# Patient Record
Sex: Male | Born: 1990 | Race: White | Hispanic: No | Marital: Single | Smoking: Current every day smoker
Health system: Southern US, Community
[De-identification: ages and names within clinical notes are randomized; demographics above are authoritative.]

## PROBLEM LIST (undated history)

## (undated) ENCOUNTER — Ambulatory Visit: Admission: EM | Payer: Self-pay

## (undated) DIAGNOSIS — Z973 Presence of spectacles and contact lenses: Secondary | ICD-10-CM

## (undated) DIAGNOSIS — Z8619 Personal history of other infectious and parasitic diseases: Secondary | ICD-10-CM

## (undated) HISTORY — PX: APPENDECTOMY: SHX54

## (undated) HISTORY — DX: Personal history of other infectious and parasitic diseases: Z86.19

## (undated) HISTORY — PX: WISDOM TOOTH EXTRACTION: SHX21

---

## 2014-03-22 ENCOUNTER — Emergency Department: Payer: Self-pay | Admitting: Emergency Medicine

## 2014-03-26 ENCOUNTER — Encounter (HOSPITAL_BASED_OUTPATIENT_CLINIC_OR_DEPARTMENT_OTHER): Payer: Self-pay | Admitting: *Deleted

## 2014-03-26 ENCOUNTER — Other Ambulatory Visit (HOSPITAL_BASED_OUTPATIENT_CLINIC_OR_DEPARTMENT_OTHER): Payer: Self-pay | Admitting: Orthopaedic Surgery

## 2014-03-26 NOTE — Progress Notes (Signed)
Will have friend bring him-aware someone has to stay with him 24hr post op

## 2014-03-27 ENCOUNTER — Ambulatory Visit (HOSPITAL_BASED_OUTPATIENT_CLINIC_OR_DEPARTMENT_OTHER)
Admission: RE | Admit: 2014-03-27 | Discharge: 2014-03-27 | Disposition: A | Discharge status: Home / Self Care | Payer: Self-pay | Source: Ambulatory Visit | Attending: Orthopaedic Surgery | Admitting: Orthopaedic Surgery

## 2014-03-27 ENCOUNTER — Ambulatory Visit (HOSPITAL_BASED_OUTPATIENT_CLINIC_OR_DEPARTMENT_OTHER): Payer: Self-pay | Admitting: Anesthesiology

## 2014-03-27 ENCOUNTER — Encounter (HOSPITAL_BASED_OUTPATIENT_CLINIC_OR_DEPARTMENT_OTHER)
Admission: RE | Disposition: A | Discharge status: Home / Self Care | Payer: Self-pay | Source: Ambulatory Visit | Attending: Orthopaedic Surgery

## 2014-03-27 ENCOUNTER — Encounter (HOSPITAL_BASED_OUTPATIENT_CLINIC_OR_DEPARTMENT_OTHER): Payer: Self-pay | Admitting: Anesthesiology

## 2014-03-27 DIAGNOSIS — S62622A Displaced fracture of medial phalanx of right middle finger, initial encounter for closed fracture: Secondary | ICD-10-CM | POA: Insufficient documentation

## 2014-03-27 DIAGNOSIS — F1210 Cannabis abuse, uncomplicated: Secondary | ICD-10-CM | POA: Insufficient documentation

## 2014-03-27 DIAGNOSIS — X58XXXA Exposure to other specified factors, initial encounter: Secondary | ICD-10-CM | POA: Insufficient documentation

## 2014-03-27 DIAGNOSIS — F17210 Nicotine dependence, cigarettes, uncomplicated: Secondary | ICD-10-CM | POA: Insufficient documentation

## 2014-03-27 DIAGNOSIS — F1099 Alcohol use, unspecified with unspecified alcohol-induced disorder: Secondary | ICD-10-CM | POA: Insufficient documentation

## 2014-03-27 HISTORY — DX: Presence of spectacles and contact lenses: Z97.3

## 2014-03-27 HISTORY — PX: CLOSED REDUCTION FINGER WITH PERCUTANEOUS PINNING: SHX5612

## 2014-03-27 SURGERY — CLOSED REDUCTION, FINGER, WITH PERCUTANEOUS PINNING
Anesthesia: General | Site: Finger | Laterality: Right

## 2014-03-27 MED ORDER — FENTANYL CITRATE 0.05 MG/ML IJ SOLN
INTRAMUSCULAR | Status: AC
  Filled 2014-03-27: qty 6

## 2014-03-27 MED ORDER — MEPERIDINE HCL 25 MG/ML IJ SOLN: 6.2500 mg | INTRAMUSCULAR | Status: DC | PRN

## 2014-03-27 MED ORDER — ONDANSETRON HCL 4 MG/2ML IJ SOLN
4.0000 mg | Freq: Once | INTRAMUSCULAR | Status: DC | PRN
Start: 2014-03-27 — End: 2014-03-27

## 2014-03-27 MED ORDER — HYDROMORPHONE HCL 1 MG/ML IJ SOLN
0.2500 mg | INTRAMUSCULAR | Status: DC | PRN
  Administered 2014-03-27: 0.5 mg via INTRAVENOUS

## 2014-03-27 MED ORDER — MIDAZOLAM HCL 2 MG/2ML IJ SOLN
INTRAMUSCULAR | Status: AC
  Filled 2014-03-27: qty 2

## 2014-03-27 MED ORDER — BUPIVACAINE HCL (PF) 0.25 % IJ SOLN
INTRAMUSCULAR | Status: AC
  Filled 2014-03-27: qty 30

## 2014-03-27 MED ORDER — LACTATED RINGERS IV SOLN
INTRAVENOUS | Status: DC
  Administered 2014-03-27 (×2): via INTRAVENOUS

## 2014-03-27 MED ORDER — CEFAZOLIN SODIUM-DEXTROSE 2-3 GM-% IV SOLR
INTRAVENOUS | Status: AC
  Filled 2014-03-27: qty 50

## 2014-03-27 MED ORDER — LACTATED RINGERS IV SOLN
INTRAVENOUS | Status: DC
  Administered 2014-03-27: 11:00:00 via INTRAVENOUS

## 2014-03-27 MED ORDER — HYDROMORPHONE HCL 1 MG/ML IJ SOLN
INTRAMUSCULAR | Status: AC
  Filled 2014-03-27: qty 1

## 2014-03-27 MED ORDER — CEFAZOLIN SODIUM-DEXTROSE 2-3 GM-% IV SOLR: 2.0000 g | INTRAVENOUS | Status: DC

## 2014-03-27 MED ORDER — MIDAZOLAM HCL 5 MG/5ML IJ SOLN
INTRAMUSCULAR | Status: DC | PRN
  Administered 2014-03-27: 2 mg via INTRAVENOUS

## 2014-03-27 MED ORDER — CEFAZOLIN SODIUM-DEXTROSE 2-3 GM-% IV SOLR
INTRAVENOUS | Status: DC | PRN
  Administered 2014-03-27: 2 g via INTRAVENOUS

## 2014-03-27 MED ORDER — OXYCODONE HCL 5 MG PO TABS: 5.0000 mg | ORAL_TABLET | ORAL | Status: DC | PRN

## 2014-03-27 MED ORDER — ONDANSETRON HCL 4 MG/2ML IJ SOLN
INTRAMUSCULAR | Status: DC | PRN
  Administered 2014-03-27: 4 mg via INTRAVENOUS

## 2014-03-27 MED ORDER — MIDAZOLAM HCL 2 MG/2ML IJ SOLN
1.0000 mg | INTRAMUSCULAR | Status: DC | PRN
  Administered 2014-03-27: 2 mg via INTRAVENOUS

## 2014-03-27 MED ORDER — FENTANYL CITRATE 0.05 MG/ML IJ SOLN
50.0000 ug | INTRAMUSCULAR | Status: DC | PRN
Start: 2014-03-27 — End: 2014-03-27

## 2014-03-27 MED ORDER — DEXAMETHASONE SODIUM PHOSPHATE 4 MG/ML IJ SOLN
INTRAMUSCULAR | Status: DC | PRN
  Administered 2014-03-27: 10 mg via INTRAVENOUS

## 2014-03-27 MED ORDER — PROPOFOL 10 MG/ML IV BOLUS
INTRAVENOUS | Status: DC | PRN
  Administered 2014-03-27: 200 mg via INTRAVENOUS

## 2014-03-27 MED ORDER — FENTANYL CITRATE 0.05 MG/ML IJ SOLN
INTRAMUSCULAR | Status: DC | PRN
  Administered 2014-03-27: 100 ug via INTRAVENOUS

## 2014-03-27 MED ORDER — LIDOCAINE HCL (CARDIAC) 20 MG/ML IV SOLN
INTRAVENOUS | Status: DC | PRN
  Administered 2014-03-27: 80 mg via INTRAVENOUS

## 2014-03-27 MED ORDER — BUPIVACAINE HCL (PF) 0.25 % IJ SOLN
INTRAMUSCULAR | Status: DC | PRN
  Administered 2014-03-27: 2 mL

## 2014-03-27 SURGICAL SUPPLY — 52 items
BANDAGE ELASTIC 3 VELCRO ST LF (GAUZE/BANDAGES/DRESSINGS) ×4 IMPLANT
BLADE HEX COATED 2.75 (ELECTRODE) IMPLANT
BLADE MINI RND TIP GREEN BEAV (BLADE) IMPLANT
BLADE SURG 15 STRL LF DISP TIS (BLADE) ×1 IMPLANT
BLADE SURG 15 STRL SS (BLADE) ×1
BNDG ESMARK 4X9 LF (GAUZE/BANDAGES/DRESSINGS) ×2 IMPLANT
BRUSH SCRUB EZ PLAIN DRY (MISCELLANEOUS) ×2 IMPLANT
CAP PIN PROTECTOR ORTHO WHT (CAP) ×2 IMPLANT
CORDS BIPOLAR (ELECTRODE) IMPLANT
COVER BACK TABLE 60X90IN (DRAPES) ×2 IMPLANT
COVER MAYO STAND STRL (DRAPES) ×2 IMPLANT
CUFF TOURNIQUET SINGLE 18IN (TOURNIQUET CUFF) ×2 IMPLANT
DECANTER SPIKE VIAL GLASS SM (MISCELLANEOUS) IMPLANT
DRAPE EXTREMITY T 121X128X90 (DRAPE) ×2 IMPLANT
DRAPE OEC MINIVIEW 54X84 (DRAPES) ×2 IMPLANT
DRAPE SURG 17X23 STRL (DRAPES) ×2 IMPLANT
GAUZE SPONGE 4X4 12PLY STRL (GAUZE/BANDAGES/DRESSINGS) ×2 IMPLANT
GAUZE XEROFORM 1X8 LF (GAUZE/BANDAGES/DRESSINGS) ×2 IMPLANT
GLOVE BIOGEL PI IND STRL 7.0 (GLOVE) ×1 IMPLANT
GLOVE BIOGEL PI IND STRL 7.5 (GLOVE) ×1 IMPLANT
GLOVE BIOGEL PI INDICATOR 7.0 (GLOVE) ×1
GLOVE BIOGEL PI INDICATOR 7.5 (GLOVE) ×1
GLOVE ECLIPSE 6.5 STRL STRAW (GLOVE) ×2 IMPLANT
GLOVE EXAM NITRILE LRG STRL (GLOVE) ×2 IMPLANT
GLOVE NEODERM STRL 7.5 LF PF (GLOVE) ×1 IMPLANT
GLOVE SURG NEODERM 7.5  LF PF (GLOVE) ×1
GLOVE SURG SS PI 7.5 STRL IVOR (GLOVE) ×2 IMPLANT
GLOVE SURG SYN 7.5  E (GLOVE) ×1
GLOVE SURG SYN 7.5 E (GLOVE) ×1 IMPLANT
GOWN PREVENTION PLUS XLARGE (GOWN DISPOSABLE) IMPLANT
GOWN STRL REIN XL XLG (GOWN DISPOSABLE) ×2 IMPLANT
K-WIRE .045X4 (WIRE) ×2 IMPLANT
NEEDLE HYPO 22GX1.5 SAFETY (NEEDLE) ×2 IMPLANT
NEEDLE HYPO 25X1 1.5 SAFETY (NEEDLE) ×2 IMPLANT
NS IRRIG 1000ML POUR BTL (IV SOLUTION) ×2 IMPLANT
PACK BASIN DAY SURGERY FS (CUSTOM PROCEDURE TRAY) ×2 IMPLANT
PAD CAST 3X4 CTTN HI CHSV (CAST SUPPLIES) ×1 IMPLANT
PADDING CAST COTTON 3X4 STRL (CAST SUPPLIES) ×1
PADDING CAST SYNTHETIC 3 NS LF (CAST SUPPLIES) ×3
PADDING CAST SYNTHETIC 3X4 NS (CAST SUPPLIES) ×3 IMPLANT
PADDING CAST SYNTHETIC 4 (CAST SUPPLIES) ×1
PADDING CAST SYNTHETIC 4X4 STR (CAST SUPPLIES) ×1 IMPLANT
SLEEVE SCD COMPRESS KNEE MED (MISCELLANEOUS) ×2 IMPLANT
SPLINT PLASTER CAST XFAST 4X15 (CAST SUPPLIES) ×10 IMPLANT
SPLINT PLASTER XTRA FAST SET 4 (CAST SUPPLIES) ×10
STOCKINETTE 4X48 STRL (DRAPES) ×2 IMPLANT
SUT ETHILON 4 0 PS 2 18 (SUTURE) IMPLANT
SYR BULB 3OZ (MISCELLANEOUS) ×2 IMPLANT
SYR CONTROL 10ML LL (SYRINGE) ×2 IMPLANT
TOWEL OR 17X24 6PK STRL BLUE (TOWEL DISPOSABLE) ×4 IMPLANT
TRAY DSU PREP LF (CUSTOM PROCEDURE TRAY) ×2 IMPLANT
UNDERPAD 30X30 INCONTINENT (UNDERPADS AND DIAPERS) ×2 IMPLANT

## 2014-03-27 NOTE — Transfer of Care (Signed)
Immediate Anesthesia Transfer of Care Note  Patient: Carlos Figueroa  Procedure(s) Performed: Procedure(s): CLOSED REDUCTION PERCUTANEOUS PINNING RIGHT THIRD FINGER (Right)  Patient Location: PACU  Anesthesia Type:General  Level of Consciousness: sedated  Airway & Oxygen Therapy: Patient Spontanous Breathing and Patient connected to face mask oxygen  Post-op Assessment: Report given to RN and Post -op Vital signs reviewed and stable  Post vital signs: Reviewed and stable  Last Vitals:  Filed Vitals:   03/27/14 1105  BP: 121/80  Pulse: 74  Temp: 36.6 C  Resp: 20    Complications: No apparent anesthesia complications

## 2014-03-27 NOTE — H&P (Addendum)
PREOPERATIVE H&P  Chief Complaint: Right 3rd finger fracture  HPI: Carlos Figueroa is a 24 y.o. male who presents for surgical treatment of Right 3rd finger fracture.  He denies any changes in medical history.  Past Medical History  Diagnosis Date  . Wears glasses    Past Surgical History  Procedure Laterality Date  . Wisdom tooth extraction    . Appendectomy      age 24    History reviewed. No pertinent family history. No Known Allergies Prior to Admission medications   Medication Sig Start Date End Date Taking? Authorizing Provider  oxyCODONE-acetaminophen (PERCOCET/ROXICET) 5-325 MG per tablet Take by mouth every 4 (four) hours as needed for severe pain.   Yes Historical Provider, MD     Positive ROS: All other systems have been reviewed and were otherwise negative with the exception of those mentioned in the HPI and as above.  Physical Exam: General: Alert, no acute distress Cardiovascular: No pedal edema Respiratory: No cyanosis, no use of accessory musculature GI: abdomen soft Skin: No lesions in the area of chief complaint Neurologic: Sensation intact distally Psychiatric: Patient is competent for consent with normal mood and affect Lymphatic: no lymphedema  MUSCULOSKELETAL: exam stable  Assessment: Right 3rd finger fracture  Plan: Plan for Procedure(s): CLOSED REDUCTION PERCUTANEOUS PINNING RIGHT 3RD FINGER  The risks benefits and alternatives were discussed with the patient including but not limited to the risks of nonoperative treatment, versus surgical intervention including infection, bleeding, nerve injury,  blood clots, cardiopulmonary complications, morbidity, mortality, among others, and they were willing to proceed.   Carlos Figueroa, Carlos Warrior Michael, MD   03/27/2014 8:11 AM

## 2014-03-27 NOTE — Op Note (Signed)
Date of surgery: 03/27/2014  Preoperative diagnosis: Middle phalanx fracture of right long finger  Postoperative diagnosis same  Procedure: Closed reduction and percutaneous skeletal fixation of middle phalanx fracture of right long finger  Surgeon: Glee ArvinMichael Xu, M.D.  Anesthesia: Gen.  Conditions: None  Condition to PACU: Stable  Indications for procedure: Carlos Figueroa is a 24 year old gentleman who presents today for surgical treatment of the above mentioned condition. He was made aware of his risks benefits and alternatives to surgery he wished to proceed.  Description of procedure: The patient was identified in the preoperative holding area. The operative site was marked by the surgeon confirmed with the patient. He is brought back to the operating room. Nonsterile tourniquet was placed on the upper right arm. General anesthesia was induced. Right upper extremity was prepped and draped in standard sterile fashion. Timeout was performed preoperative antibiotics were given. A right upper extremity was exsanguinated using Esmarch bandage and tourniquet was inflated to 250 mmHg. We were able to affect a closed reduction of the fracture. This was confirmed under mini C-arm. With the fracture reduced we placed 2 0.045 K wires and a crossing fashion from distal to proximal. Care was taken to avoid the proximal phalanx and the joint. Orthogonal x-rays were taken to confirm maintenance of the reduction and adequate pin placement. The tourniquet was deflated and the finger had normal capillary refill. Sterile dressings were applied. The pin ends were capped. The upper extremity was placed in a dorsal blocking splint in intrinsic plus. Patient was extubated and transferred to the PACU in stable condition.  Postoperative plan: Patient will be nonweightbearing to the right upper extremity. He will follow-up in a couple weeks in the office for wound check. We will plan on keeping the pins in for 4-6 weeks.  Mayra ReelN.  Michael Xu, MD [facility] [phone] 12:38 PM

## 2014-03-27 NOTE — Anesthesia Postprocedure Evaluation (Signed)
Anesthesia Post Note  Patient: Research scientist (life sciences)Carlos Figueroa  Procedure(s) Performed: Procedure(s) (LRB): CLOSED REDUCTION PERCUTANEOUS PINNING RIGHT THIRD FINGER (Right)  Anesthesia type: general  Patient location: PACU  Post pain: Pain level controlled  Post assessment: Patient's Cardiovascular Status Stable  Last Vitals:  Filed Vitals:   03/27/14 1355  BP: 126/82  Pulse: 78  Temp: 36.5 C  Resp: 16    Post vital signs: Reviewed and stable  Level of consciousness: sedated  Complications: No apparent anesthesia complications

## 2014-03-27 NOTE — Anesthesia Procedure Notes (Signed)
Procedure Name: LMA Insertion Date/Time: 03/27/2014 11:59 AM Performed by: Genevieve NorlanderLINKA, Carlos Figueroa: Patient identified, Emergency Drugs available, Suction available, Patient being monitored and Timeout performed Patient Re-evaluated:Patient Re-evaluated prior to inductionOxygen Delivery Method: Circle System Utilized Preoxygenation: Pre-oxygenation with 100% oxygen Intubation Type: IV induction Ventilation: Mask ventilation without difficulty LMA: LMA inserted LMA Size: 5.0 Number of attempts: 1 Airway Equipment and Method: Bite block Placement Confirmation: positive ETCO2 Tube secured with: Tape Dental Injury: Teeth and Oropharynx as per pre-operative assessment

## 2014-03-27 NOTE — Anesthesia Preprocedure Evaluation (Signed)
Anesthesia Evaluation  Patient identified by MRN, date of birth, ID band Patient awake    Reviewed: Allergy & Precautions, NPO status , Patient's Chart, lab work & pertinent test results  Airway Mallampati: I  TM Distance: >3 FB Neck ROM: Full    Dental   Pulmonary Current Smoker,          Cardiovascular     Neuro/Psych    GI/Hepatic   Endo/Other    Renal/GU      Musculoskeletal   Abdominal   Peds  Hematology   Anesthesia Other Findings   Reproductive/Obstetrics                             Anesthesia Physical Anesthesia Plan  ASA: I  Anesthesia Plan: General   Post-op Pain Management:    Induction: Intravenous  Airway Management Planned: LMA  Additional Equipment:   Intra-op Plan:   Post-operative Plan: Extubation in OR  Informed Consent: I have reviewed the patients History and Physical, chart, labs and discussed the procedure including the risks, benefits and alternatives for the proposed anesthesia with the patient or authorized representative who has indicated his/her understanding and acceptance.     Plan Discussed with: CRNA and Surgeon  Anesthesia Plan Comments:         Anesthesia Quick Evaluation  

## 2014-03-27 NOTE — Discharge Instructions (Signed)
Postoperative instructions: ° °Weightbearing: non weight bearing ° °Dressing instructions: Keep your dressing and/or splint clean and dry at all times.  It will be removed at your first post-operative appointment.  Your stitches and/or staples will be removed at this visit. ° °Incision instructions:  Do not soak your incision for 3 weeks after surgery.  If the incision gets wet, pat dry and do not scrub the incision. ° °Pain control:  You have been given a prescription to be taken as directed for post-operative pain control.  In addition, elevate the operative extremity above the heart at all times to prevent swelling and throbbing pain. ° °Take over-the-counter Colace, 100mg by mouth twice a day while taking narcotic pain medications to help prevent constipation. ° °Follow up appointments: °1) 10-14 days for suture removal and wound check. °2) Dr. Xu as scheduled. ° ° ------------------------------------------------------------------------------------------------------------- ° °After Surgery Pain Control: ° °After your surgery, post-surgical discomfort or pain is likely. This discomfort can last several days to a few weeks. At certain times of the day your discomfort may be more intense.  °Did you receive a nerve block?  °A nerve block can provide pain relief for one hour to two days after your surgery. As long as the nerve block is working, you will experience little or no sensation in the area the surgeon operated on.  °As the nerve block wears off, you will begin to experience pain or discomfort. It is very important that you begin taking your prescribed pain medication before the nerve block fully wears off. Treating your pain at the first sign of the block wearing off will ensure your pain is better controlled and more tolerable when full-sensation returns. Do not wait until the pain is intolerable, as the medicine will be less effective. It is better to treat pain in advance than to try and catch up.    °General Anesthesia:  °If you did not receive a nerve block during your surgery, you will need to start taking your pain medication shortly after your surgery and should continue to do so as prescribed by your surgeon.  °Pain Medication:  °Most commonly we prescribe Vicodin and Percocet for post-operative pain. Both of these medications contain a combination of acetaminophen (Tylenol®) and a narcotic to help control pain.  °· It takes between 30 and 45 minutes before pain medication starts to work. It is important to take your medication before your pain level gets too intense.  °· Nausea is a common side effect of many pain medications. You will want to eat something before taking your pain medicine to help prevent nausea.  °· If you are taking a prescription pain medication that contains acetaminophen, we recommend that you do not take additional over the counter acetaminophen (Tylenol®).  °Other pain relieving options:  °· Using a cold pack to ice the affected area a few times a day (15 to 20 minutes at a time) can help to relieve pain, reduce swelling and bruising.  °· Elevation of the affected area can also help to reduce pain and swelling. ° ° ° ° °Post Anesthesia Home Care Instructions ° °Activity: °Get plenty of rest for the remainder of the day. A responsible adult should stay with you for 24 hours following the procedure.  °For the next 24 hours, DO NOT: °-Drive a car °-Operate machinery °-Drink alcoholic beverages °-Take any medication unless instructed by your physician °-Make any legal decisions or sign important papers. ° °Meals: °Start with liquid foods such as gelatin   or soup. Progress to regular foods as tolerated. Avoid greasy, spicy, heavy foods. If nausea and/or vomiting occur, drink only clear liquids until the nausea and/or vomiting subsides. Call your physician if vomiting continues.  Special Instructions/Symptoms: Your throat may feel dry or sore from the anesthesia or the breathing tube  placed in your throat during surgery. If this causes discomfort, gargle with warm salt water. The discomfort should disappear within 24 hours.

## 2014-03-28 ENCOUNTER — Encounter (HOSPITAL_BASED_OUTPATIENT_CLINIC_OR_DEPARTMENT_OTHER): Payer: Self-pay | Admitting: Orthopaedic Surgery

## 2014-04-07 DIAGNOSIS — Z973 Presence of spectacles and contact lenses: Secondary | ICD-10-CM | POA: Insufficient documentation

## 2014-04-07 DIAGNOSIS — G8911 Acute pain due to trauma: Secondary | ICD-10-CM | POA: Insufficient documentation

## 2014-04-07 DIAGNOSIS — M79641 Pain in right hand: Secondary | ICD-10-CM | POA: Insufficient documentation

## 2014-04-07 DIAGNOSIS — Z720 Tobacco use: Secondary | ICD-10-CM | POA: Insufficient documentation

## 2014-04-08 ENCOUNTER — Emergency Department (HOSPITAL_COMMUNITY)
Admission: EM | Admit: 2014-04-08 | Discharge: 2014-04-08 | Disposition: A | Discharge status: Home / Self Care | Payer: Self-pay | Attending: Emergency Medicine | Admitting: Emergency Medicine

## 2014-04-08 ENCOUNTER — Emergency Department (HOSPITAL_COMMUNITY): Payer: Self-pay

## 2014-04-08 ENCOUNTER — Encounter (HOSPITAL_COMMUNITY): Payer: Self-pay | Admitting: Emergency Medicine

## 2014-04-08 DIAGNOSIS — M79641 Pain in right hand: Secondary | ICD-10-CM

## 2014-04-08 NOTE — ED Notes (Signed)
Pt. Left with all belongings and refused wheelchair 

## 2014-04-08 NOTE — ED Notes (Signed)
The patient said he had surgery done here a week ago and thinks he may have reinjured his finger.  He is here to have his wound checked.  He says he has hit his hand several times by accident.  He has good capillary refill, fingers are the appropriate color.  He rates his pain 2/10.

## 2014-04-08 NOTE — ED Provider Notes (Signed)
CSN: [id number]     Arrival date & time 04/07/14  2349 History   First MD Initiated Contact with Patient 04/08/14 0113     Chief Complaint  Patient presents with  . Wound Check    The patient said he had surgery done here a week ago and thinks he may have reinjured his finger.  He is here to have his wound checked.  He says he has hit his hand several times by accident.     (Consider location/radiation/quality/duration/timing/severity/associated sxs/prior Treatment) Patient is a 24 y.o. male presenting with wound check. The history is provided by the patient. No language interpreter was used.  Wound Check Associated symptoms include arthralgias (right hand pain ) and numbness. Pertinent negatives include no joint swelling or weakness.  Carlos Figueroa is a 24 y/o M with PMHx of closed reduction of right ring finger with percutaneous pinning performed on 03/27/2014 by Dr. Roda Shutters secondary to fracturing right ring finger after bowling accident. Patient reported that his hand is in a splint, but continues to hit his hand on objects continuously. Reported at 6 PM today he had his hand on the banister resulting in intense burning sensation to his right ring finger. Patient reported that he started to have pins and needle sensation in the right middle finger-reported that this has been ongoing for approximately 2 weeks since his surgery, but reported that it increased. Patient reported that he's been taking Roxicodone without relief. Stated he does have an appointment with orthopedics on 04/12/2014 with Dr. Roda Shutters. Denied changes to temperature, color, fever, red streaks, swelling, bleeding. PCP none  Past Medical History  Diagnosis Date  . Wears glasses    Past Surgical History  Procedure Laterality Date  . Wisdom tooth extraction    . Appendectomy      age 21  . Closed reduction finger with percutaneous pinning Right 03/27/2014    Procedure: CLOSED REDUCTION PERCUTANEOUS PINNING RIGHT THIRD FINGER;   Surgeon: Cheral Almas, MD;  Location: [facility];  Service: Orthopedics;  Laterality: Right;   History reviewed. No pertinent family history. History  Substance Use Topics  . Smoking status: Current Every Day Smoker -- 0.50 packs/day  . Smokeless tobacco: Not on file  . Alcohol Use: Yes     Comment: occ    Review of Systems  Musculoskeletal: Positive for arthralgias (right hand pain ). Negative for joint swelling.  Neurological: Positive for numbness. Negative for weakness.      Allergies  Review of patient's allergies indicates no known allergies.  Home Medications   Prior to Admission medications   Medication Sig Start Date End Date Taking? Authorizing Provider  oxyCODONE (OXY IR/ROXICODONE) 5 MG immediate release tablet Take 1-3 tablets (5-15 mg total) by mouth every 4 (four) hours as needed. 03/27/14   Naiping Glee Arvin, MD  oxyCODONE-acetaminophen (PERCOCET/ROXICET) 5-325 MG per tablet Take by mouth every 4 (four) hours as needed for severe pain.    Historical Provider, MD   BP 132/80 mmHg  Pulse 74  Temp(Src) 98.1 F (36.7 C) (Oral)  Resp 20  Ht  (1.88 m)  Wt 205 lb (92.987 kg)  BMI 26.31 kg/m2  SpO2 98% Physical Exam  Constitutional: He is oriented to person, place, and time. He appears well-developed and well-nourished. No distress.  HENT:  Head: Normocephalic and atraumatic.  Eyes: Conjunctivae and EOM are normal. Right eye exhibits no discharge. Left eye exhibits no discharge.  Neck: Normal range of motion. Neck supple.  Cardiovascular: Normal rate, regular rhythm and normal heart sounds.  Exam reveals no friction rub.   No murmur heard. Cap refill < 3 seconds  Pulmonary/Chest: Effort normal and breath sounds normal. No respiratory distress. He has no wheezes. He has no rales.  Musculoskeletal:  Patient in dorsal splint of the right hand, in a flexed manner. Patient is able to wiggle fingers without difficulty or pain noted.   Neurological: He is alert and oriented to person, place, and time.  Sensation intact with differentiation sharp and dull touch  Skin: He is not diaphoretic.  Nursing note and vitals reviewed.   ED Course  Procedures (including critical care time)  Dg Hand Complete Right  04/08/2014   ADDENDUM REPORT: 04/08/2014 03:25  ADDENDUM: The findings and impression should be as follows:  FINDINGS: Two pins are seen transfixing the fracture through the middle phalanx of the right third finger in grossly anatomic alignment. No new fractures are seen. The fracture is less well characterized on this study due to patient position.  The carpal rows appear grossly intact, and demonstrate normal alignment. Visualized joint spaces are preserved.  The soft tissues are difficult to fully assess due to the patient's splint.  IMPRESSION: No new fracture seen; known fracture through the middle phalanx of the right third finger is less well characterized than on the prior study. Visualized hardware appears intact.   Electronically Signed   By: Roanna Raider M.D.   On: 04/08/2014 03:25   04/08/2014   CLINICAL DATA:  Recent right middle finger surgery. Right hand pain, acute onset. Subsequent encounter.  EXAM: RIGHT HAND - COMPLETE 3+ VIEW  COMPARISON:  Right hand radiographs performed 03/22/2014.  FINDINGS: Two pins are seen transfixing the fracture through the middle phalanx of the right fourth finger in grossly anatomic alignment. No new fractures are seen. The fracture is less well characterized on this study due to patient position.  The carpal rows appear grossly intact, and demonstrate normal alignment. Visualized joint spaces are preserved.  The soft tissues are difficult to fully assess due to the patient's splint.  IMPRESSION: No new fracture seen; known fracture through the middle phalanx of the right fourth finger is less well characterized than on the prior study. Visualized hardware appears intact.  Electronically  Signed: By: Roanna Raider M.D. On: 04/08/2014 02:44    Labs Review Labs Reviewed - No data to display  Imaging Review Dg Hand Complete Right  04/08/2014   ADDENDUM REPORT: 04/08/2014 03:25  ADDENDUM: The findings and impression should be as follows:  FINDINGS: Two pins are seen transfixing the fracture through the middle phalanx of the right third finger in grossly anatomic alignment. No new fractures are seen. The fracture is less well characterized on this study due to patient position.  The carpal rows appear grossly intact, and demonstrate normal alignment. Visualized joint spaces are preserved.  The soft tissues are difficult to fully assess due to the patient's splint.  IMPRESSION: No new fracture seen; known fracture through the middle phalanx of the right third finger is less well characterized than on the prior study. Visualized hardware appears intact.   Electronically Signed   By: Roanna Raider M.D.   On: 04/08/2014 03:25   04/08/2014   CLINICAL DATA:  Recent right middle finger surgery. Right hand pain, acute onset. Subsequent encounter.  EXAM: RIGHT HAND - COMPLETE 3+ VIEW  COMPARISON:  Right hand radiographs performed 03/22/2014.  FINDINGS: Two pins are seen transfixing the fracture through  the middle phalanx of the right fourth finger in grossly anatomic alignment. No new fractures are seen. The fracture is less well characterized on this study due to patient position.  The carpal rows appear grossly intact, and demonstrate normal alignment. Visualized joint spaces are preserved.  The soft tissues are difficult to fully assess due to the patient's splint.  IMPRESSION: No new fracture seen; known fracture through the middle phalanx of the right fourth finger is less well characterized than on the prior study. Visualized hardware appears intact.  Electronically Signed: By: Roanna RaiderJeffery  Chang M.D. On: 04/08/2014 02:44     EKG Interpretation None       3:18 AM Spoke with Dr. Cherly Hensenhang,  radiologist. Error regarding digit-reported that was long finger, physician meant middle finger where patient surgery occurred.   MDM   Final diagnoses:  Right hand pain    Medications - No data to display  Filed Vitals:   04/07/14 2358 04/08/14 0351  BP: 117/81 132/80  Pulse: 63 74  Temp: 98.1 F (36.7 C)   TempSrc: Oral   Resp: 20 20  Height: 6\' 2"  (1.88 m)   Weight: 205 lb (92.987 kg)   SpO2: 97% 98%    Plain film of right hand no new fracture seen. Known fracture through the middle phalanx of the right third finger is less well-visualized, visualized hardware appears to be intact. Refill less than 3 seconds. Fingertips are warm. Negative pain upon palpation to the fingertips. Negative erythema changes to skin colored to the fingertips seen to the dorsal splint. Patient is able to wiggle fingers in the splint without difficulty. Discussed case in great detail with attending physician, Dr. Ranae PalmsYelverton, who recommended plain film and if negative for patient to be discharged home with follow-up with orthopedic surgeon. Imaging unremarkable for acute injury-hardware appears to be intact and in place. Doubt compartment syndrome. Negative signs of ischemia. Patient stable, afebrile. Patient not septic appearing. Discharged patient. Discussed with patient to keep appointment with orthopedic surgeon on Friday, 04/12/2014. Discussed with patient to rest, ice, elevate. Discussed with patient to closely monitor symptoms and if symptoms are to worsen or change to report back to the ED - strict return instructions given.  Patient agreed to plan of care, understood, all questions answered.   Raymon MuttonMarissa Kieley Akter, PA-C 04/08/14 0413  Carlos Raceravid Yelverton, MD 04/09/14 [phone]

## 2014-04-08 NOTE — Discharge Instructions (Signed)
Please keep appointment with Dr. Roda ShuttersXu on 04/12/2014 Please keep splint on and dry Please elevate hands-above heart level at all times Please closely watch the right hand and avoid hitting it into objects Please continue to monitor symptoms closely and if symptoms are to worsen or change (fever greater than 101, chills, sweating, nausea, vomiting, chest pain, shortness of breathe, difficulty breathing, weakness, numbness, tingling, worsening or changes to pain pattern, fall, injury, loss of sensation, changes to skin colored, pain upon palpation to the fingertips, exquisite pain with motion to the fingers) please report back to the Emergency Department immediately.     Emergency Department Resource Guide 1) Find a Doctor and Pay Out of Pocket Although you won't have to find out who is covered by your insurance plan, it is a good idea to ask around and get recommendations. You will then need to call the office and see if the doctor you have chosen will accept you as a new patient and what types of options they offer for patients who are self-pay. Some doctors offer discounts or will set up payment plans for their patients who do not have insurance, but you will need to ask so you aren't surprised when you get to your appointment.  2) Contact Your Local Health Department Not all health departments have doctors that can see patients for sick visits, but many do, so it is worth a call to see if yours does. If you don't know where your local health department is, you can check in your phone book. The CDC also has a tool to help you locate your state's health department, and many state websites also have listings of all of their local health departments.  3) Find a Walk-in Clinic If your illness is not likely to be very severe or complicated, you may want to try a walk in clinic. These are popping up all over the country in pharmacies, drugstores, and shopping centers. They're usually staffed by nurse  practitioners or physician assistants that have been trained to treat common illnesses and complaints. They're usually fairly quick and inexpensive. However, if you have serious medical issues or chronic medical problems, these are probably not your best option.  No Primary Care Doctor: - Call Health Connect at  [phone] - they can help you locate a primary care doctor that  accepts your insurance, provides certain services, etc. - Physician Referral Service- [phone]  Chronic Pain Problems: Organization         Address  Phone   Notes  [facility]  [phone] Patients need to be referred by their primary care doctor.   Medication Assistance: Organization         Address  Phone   Notes  [facility] [address] [place], [place] [address] [phone]) [phone] --Must be a resident of [facility] -- Must have NO insurance coverage whatsoever (no Medicaid/ Medicare, etc.) -- The pt. MUST have a primary care doctor that directs their care regularly and follows them in the community   MedAssist  [phone]   [employer]  [phone]    Agencies that provide inexpensive medical care: Organization         Address  Phone   Notes  [facility]  [phone]) [phone]   [facility]    [phone]) [phone]   [facility] [address] [place], [place] [address] [phone]) [phone]   [facility] [address], [place] [phone]   Planned Parenthood    [phone]) [phone]   [facility]    [phone](336) [phone]   [facility]  [address], [place] Phone:  [phone](336) [phone], Fax:  [phone] Hours of Operation:  9 am - 6 pm, M-F.  Also accepts Medicaid/Medicare and self-pay.  [facility]  [address], [place] Phone: [phone]) [phone], Fax: [phone]. Hours of Operation:  8:30 am -  5:30 pm, M-F.  Also accepts Medicaid and self-pay.  [facility] [place] [address], [place] Phone: [phone]) [phone]   [facility] [address], [place], [place] [phone], Ext. [phone] Mondays & Thursdays: 7-9 AM.  First 15 patients are seen on a first come, first serve basis.    Medicaid-accepting [facility] Providers:  Organization         Address  Phone   Notes  [facility] [address], [place] [phone]) [phone] Also accepts self-pay patients.  [facility] [address], [place]  [phone]   [facility] [address], [place] [phone]   [facility] [address] [place] [address], [place] [phone]   Renaye RakersVeita Bland [address], [place]   [phone] Only accepts [place] Access [place] patients after they have their name applied to their card.   Self-Pay (no insurance) in [facility]:  Organization         Address  Phone   Notes  Sickle Cell Patients, [facility] [address], [place] [phone]) [phone]   [facility] [address], [place] [phone]) [phone]   [facility] [place]  [address], [place] [phone]   [facility]/Dr. Osei-Bonsu  [address] [place] [address], [place] or [phone], [place] [phone] Phone number for both [place] and [place] locations is the same.  [facility] [address], [place] [phone]   [facility] [address] [place] [address], [place] or [address] [phone]) [phone]   [facility] [address], [place] [phone]) [phone], phone; [phone]) [phone], fax Sees patients 1st and 3rd Saturday of every month.  Must not qualify for public or private insurance (i.e. Medicaid, Medicare, [place] Health Choice,  Veterans' Benefits)  Household income should be no more than 200% of the poverty level The clinic cannot treat you if you are pregnant or think you are pregnant  Sexually transmitted diseases are not treated at the clinic.    Dental Care: Organization         Address  Phone  Notes  [facility] [address], [place] [phone] Accepts children up to age 24 who are enrolled in [place] or [place] Health Choice; pregnant women with a Medicaid card; and children who have applied for Medicaid or [place] Health Choice, but were declined, whose parents can pay a reduced fee at time of service.  [facility] [place]  [address], [place] [phone]) [phone] Accepts children up to age 24 who are enrolled in [place] or [place] Health Choice; pregnant women with a Medicaid card; and children who  have applied for Medicaid or [place] Health Choice, but were declined, whose parents can pay a reduced fee at time of service.  [facility]  [address] [place], [place] [phone] Patients are seen by appointment only. Walk-ins are not accepted. [facility] will see patients 52 years of age and older. Monday - Tuesday (8am-5pm) Most Wednesdays (8:30-5pm) $30 per visit, cash only  [facility] Adult Dental Access PROGRAM  [address], [facility] [phone] Patients are seen by appointment only. Walk-ins are not accepted. [facility] will see patients 46 years of age and older. One Wednesday Evening (Monthly: Volunteer Based).  $30 per visit, cash only  [employer]  [phone] for adults; Children under age 39, call Graduate Pediatric Dentistry at [phone]. Children aged 65-14, please call [phone] to request a pediatric application.  Dental services are provided in all areas of dental care including fillings, crowns and bridges, complete and  partial dentures, implants, gum treatment, root canals, and extractions. Preventive care is also provided. Treatment is provided to both adults and children. Patients are selected via a lottery and there is often a waiting list.   [facility] [address] [place]  [phone] [email or web address]   [facility] [address] [place], [place] [phone], Ext. [phone] Second and Fourth Thursday of each month, opens at 6:30 AM; Clinic ends at 9 AM.  Patients are seen on a first-come first-served basis, and a limited number are seen during each clinic.   [facility]  [address] [place], [place] [phone]   Eligibility Requirements You must have lived in [place], [place], or [place] counties for at least the last three months.   You cannot be eligible for state or federal sponsored [place], including [employer], [place], or [employer].   You generally cannot be eligible for healthcare insurance through your employer.    How to apply: Eligibility screenings are held every Tuesday and Wednesday afternoon from 1:00 pm until 4:00 pm. You do not need an appointment for the interview!  [facility] [address], [place], [place] [phone]   [facility] Health Department  [phone]   [facility] Health Department  [phone]   [facility] Health Department  [phone]    Behavioral Health Resources in the Community: Intensive Outpatient Programs Organization         Address  Phone  Notes  [facility] [address], [place], [place] [phone]   [facility] Outpatient [address], [place], [place] [phone]   [facility] [address] [place], [place]  [phone]   [facility] Mental Health [address]  [place], [place] [phone] or [phone]   Substance Abuse Resources Organization          Address  Phone  Notes  [facility] Drug Services  [phone]   [facility]  [phone]   [facility]  [phone]   Floydene Flock  [phone]   Residential & Outpatient Substance Abuse Program  [phone]   Psychological Services Organization         Address  Phone  Notes  [facility] Behavioral Health  [phone]   [facility] Services  [phone]   [facility] Mental Health [address] [place] [phone] or [phone]    Mobile Crisis Teams Organization  Address  Phone  Notes  [facility], Mobile Crisis Care Unit  [phone]   [facility]  [address] [place], [place] [phone]   [facility] [address] [place] [phone]    Self-Help/Support Groups Organization         Address  Phone             Notes  [facility] [place] - variety of support groups  [phone]- I7437963 Call for more information  Narcotics Anonymous (NA), [facility] [address], [employer] [place]  2 meetings at this location   Statistician         Address  Phone  Notes  [facility] [address],    [place]  [phone]   [facility]  [address], [place] [address], [place], [place] [phone]   [facility] [address] [place], [place] [phone] Admissions: 8am-3pm M-F  [facility] [address]    [place], [place] [phone]   [facility] [address] [place], [place], [place] [phone]   [facility] [address]  [place], [place] [phone]   [facility] - Intensive Outpatient [address], [place], [place] [phone]   [facility] [address]  [place], [place] [phone] or [phone]   [facility] ([facility]) [address], [place], [place]  [phone] Accepts Medicaid  [facility] [place] [address]  [place] [phone] Substance Abuse/Addiction Treatment   [facility] Organization         Address  Phone  Notes  [facility]  [phone]   Angie Fava, PhD [address] [place], [place]   [phone] or [phone]   [facility] Behavioral   [address] [place], [place] [phone]   [facility] [address], [place], [place] [phone] Insurance/Medicaid/sponsorship through [facility] and Families [address]                                    [place], [place] [phone] Therapy/tele-psych/case  [facility] [address], [place] [phone]    Dr. Lolly Mustache  [phone]   [facility]  [employer] [facility] Dept. 1) [address] [place] 2) [address] [place] 3)  [address], [place] [phone]  [phone]   [facility] Child Abuse Hotline 5074981637 or [phone] (After Hours)

## 2016-02-09 ENCOUNTER — Ambulatory Visit (INDEPENDENT_AMBULATORY_CARE_PROVIDER_SITE_OTHER): Payer: Managed Care, Other (non HMO) | Admitting: Internal Medicine

## 2016-02-09 ENCOUNTER — Encounter: Payer: Self-pay | Admitting: Internal Medicine

## 2016-02-09 VITALS — BP 120/70 | HR 84 | Temp 98.3°F | Ht 71.5 in | Wt 180.0 lb

## 2016-02-09 DIAGNOSIS — H6121 Impacted cerumen, right ear: Secondary | ICD-10-CM

## 2016-02-09 DIAGNOSIS — Z0001 Encounter for general adult medical examination with abnormal findings: Secondary | ICD-10-CM

## 2016-02-09 DIAGNOSIS — Z23 Encounter for immunization: Secondary | ICD-10-CM | POA: Diagnosis not present

## 2016-02-09 NOTE — Progress Notes (Addendum)
HPI  Pt presents to the clinic today to establish care. He has not had a PCP in many years, but was seen at [facility] as a child.  Flu: never Tetanus: unsure Dentist: as needed  Diet: He does eat lean meat. He consumes fruits and veggies some days. He does eat fried foods. He drinks mostly sweet tea. Exercise: None  Past Medical History:  Diagnosis Date  . History of chicken pox   . Wears glasses     No current outpatient prescriptions on file.   No current facility-administered medications for this visit.     No Known Allergies  Family History  Problem Relation Age of Onset  . Alcohol abuse Father   . Colon polyps Father   . Stroke Father   . Diabetes Father   . Arthritis Maternal Grandmother   . Stroke Maternal Grandmother   . Mental illness Maternal Grandmother   . Diabetes Maternal Grandmother   . Alcohol abuse Maternal Grandfather   . Colon cancer Paternal Grandmother     Social History   Social History  . Marital status: Single    Spouse name: N/A  . Number of children: N/A  . Years of education: N/A   Occupational History  . Not on file.   Social History Main Topics  . Smoking status: Current Every Day Smoker    Packs/day: 1.00  . Smokeless tobacco: Never Used  . Alcohol use Yes     Comment: occasional  . Drug use:     Types: Marijuana     Comment: freq  . Sexual activity: Not on file   Other Topics Concern  . Not on file   Social History Narrative  . No narrative on file    ROS:  Constitutional: Denies fever, malaise, fatigue, headache or abrupt weight changes.  HEENT: Pt reports right ear fullness. Denies eye pain, eye redness, ear pain, ringing in the ears, wax buildup, runny nose, nasal congestion, bloody nose, or sore throat. Respiratory: Denies difficulty breathing, shortness of breath, cough or sputum production.   Cardiovascular: Denies chest pain, chest tightness, palpitations or swelling in the hands or feet.   Gastrointestinal: Denies abdominal pain, bloating, constipation, diarrhea or blood in the stool.  GU: Denies frequency, urgency, pain with urination, blood in urine, odor or discharge. Musculoskeletal: Denies decrease in range of motion, difficulty with gait, muscle pain or joint pain and swelling.  Skin: Denies redness, rashes, lesions or ulcercations.  Neurological: Denies dizziness, difficulty with memory, difficulty with speech or problems with balance and coordination.  Psych: Denies anxiety, depression, SI/HI.  No other specific complaints in a complete review of systems (except as listed in HPI above).  PE:  BP 120/70   Pulse 84   Temp 98.3 F (36.8 C) (Oral)   Ht 5' 11.5" (1.816 m)   Wt 180 lb (81.6 kg)   SpO2 97%   BMI 24.76 kg/m  Wt Readings from Last 3 Encounters:  02/09/16 180 lb (81.6 kg)  04/07/14 205 lb (93 kg)  03/27/14 203 lb (92.1 kg)    General: Appears his stated age, well developed, well nourished in NAD. HEENT: Head: normal shape and size; Eyes: sclera white, no icterus, conjunctiva pink, PERRLA and EOMs intact; Right Ear: Cerumen impaction; Left Ear: Tm's gray and intact, normal light reflex;Throat/Mouth: Teeth present, mucosa pink and moist, no lesions or ulcerations noted.  Neck: Neck supple, trachea midline. No masses, lumps or thyromegaly present.  Cardiovascular: Normal rate and rhythm. S1,S2  noted.  No murmur, rubs or gallops noted. No JVD or BLE edema.  Pulmonary/Chest: Normal effort and positive vesicular breath sounds. No respiratory distress. No wheezes, rales or ronchi noted.  Abdomen: Soft and nontender. Normal bowel sounds. No distention or masses noted. Liver, spleen and kidneys non palpable. Musculoskeletal: Normal range of motion. Strength 5/5 BUE/BLE. No signs of joint swelling. No difficulty with gait.  Neurological: Alert and oriented. Cranial nerves II-XII grossly intact. Coordination normal.  Psychiatric: Mood and affect normal. Behavior  is normal. Judgment and thought content normal.   Assessment and Plan:  Preventative Health Maintenance:  He declines flu shot today Tdap given today Encouraged him to consume a balanced diet and exercise regimen Advised him to see a dentist annually Will check CBC< CMET, Lipid and HIV today  Right cerumen impaction:  Try Debrox OTC  RTC in 1 year for your annual exam Nicki ReaperBAITY, Kandie Keiper, NP

## 2016-02-09 NOTE — Patient Instructions (Signed)

## 2016-02-10 LAB — COMPREHENSIVE METABOLIC PANEL
ALK PHOS: 54 U/L (ref 39–117)
ALT: 6 U/L (ref 0–53)
AST: 12 U/L (ref 0–37)
Albumin: 4.7 g/dL (ref 3.5–5.2)
BILIRUBIN TOTAL: 0.8 mg/dL (ref 0.2–1.2)
BUN: 7 mg/dL (ref 6–23)
CO2: 36 mEq/L — ABNORMAL HIGH (ref 19–32)
CREATININE: 1.13 mg/dL (ref 0.40–1.50)
Calcium: 10 mg/dL (ref 8.4–10.5)
Chloride: 103 mEq/L (ref 96–112)
GFR: 83.64 mL/min (ref >60.00)
Glucose, Bld: 84 mg/dL (ref 70–99)
Potassium: 4.6 mEq/L (ref 3.5–5.1)
Sodium: 143 mEq/L (ref 135–145)
TOTAL PROTEIN: 7.3 g/dL (ref 6.0–8.3)

## 2016-02-10 LAB — LIPID PANEL
Cholesterol: 176 mg/dL (ref 0–200)
HDL: 36.6 mg/dL — ABNORMAL LOW (ref >39.00)
LDL CALC: 103 mg/dL — AB (ref 0–99)
NonHDL: 139.28
Total CHOL/HDL Ratio: 5
Triglycerides: 183 mg/dL — ABNORMAL HIGH (ref 0.0–149.0)
VLDL: 36.6 mg/dL (ref 0.0–40.0)

## 2016-02-10 LAB — CBC
HCT: 45.6 % (ref 39.0–52.0)
Hemoglobin: 15.8 g/dL (ref 13.0–17.0)
MCHC: 34.7 g/dL (ref 30.0–36.0)
MCV: 89.8 fl (ref 78.0–100.0)
Platelets: 209 10*3/uL (ref 150.0–400.0)
RBC: 5.08 Mil/uL (ref 4.22–5.81)
RDW: 13.2 % (ref 11.5–15.5)
WBC: 8 10*3/uL (ref 4.0–10.5)

## 2016-02-10 LAB — HIV ANTIBODY (ROUTINE TESTING W REFLEX): HIV: NONREACTIVE

## 2016-02-22 NOTE — Addendum Note (Signed)
Addended by: Roena MaladyEVONTENNO, Anis Cinelli Y on: 02/22/2016 05:24 PM   Modules accepted: Orders

## 2016-04-04 IMAGING — CR RIGHT MIDDLE FINGER 2+V
1 series · 3 of 3 positions shown · non-contrast
Comparison: None.

CLINICAL DATA: Finger stuck in bowling ball, with right middle
finger pain and swelling. Initial encounter.

EXAM:
RIGHT MIDDLE FINGER 2+V

[Series 1: dxr finger mid 3rd digit rt hand · 0.14mm/px · 3 of 3 slices shown]
[im 1/3]
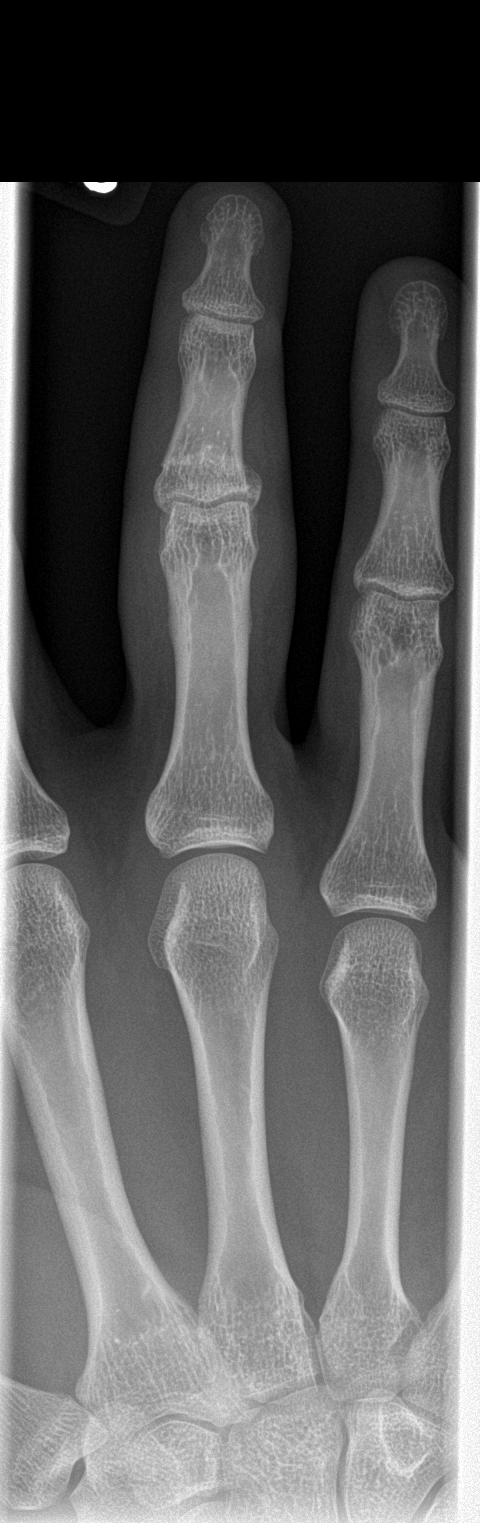
[im 2/3]
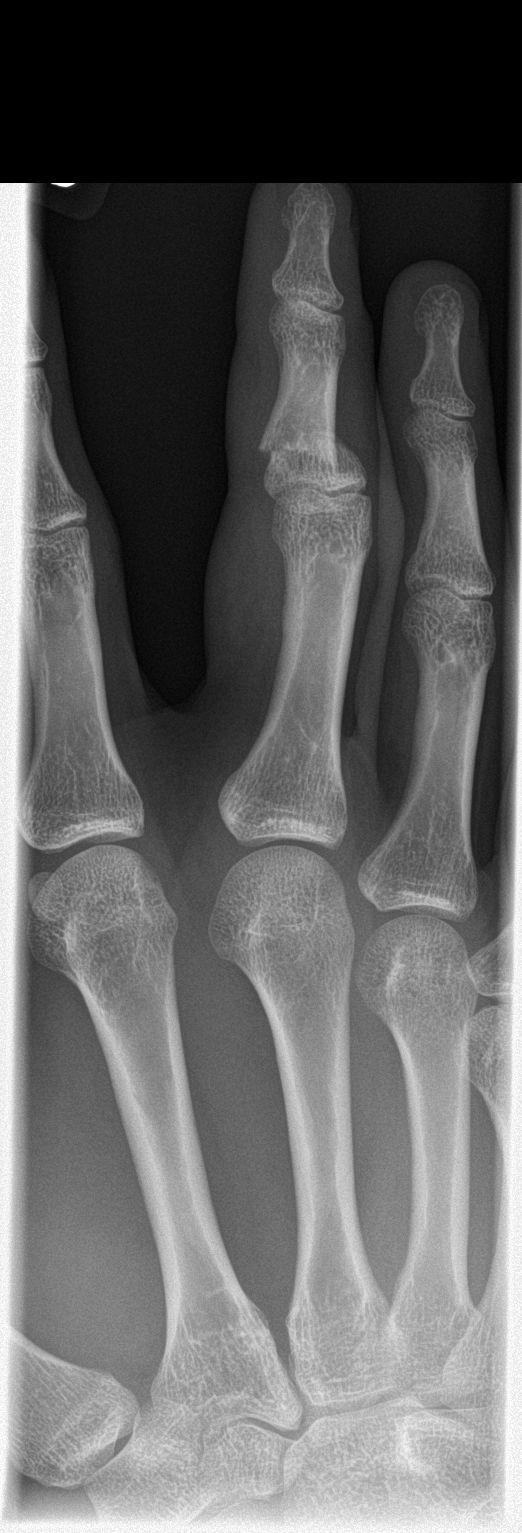
[im 3/3]
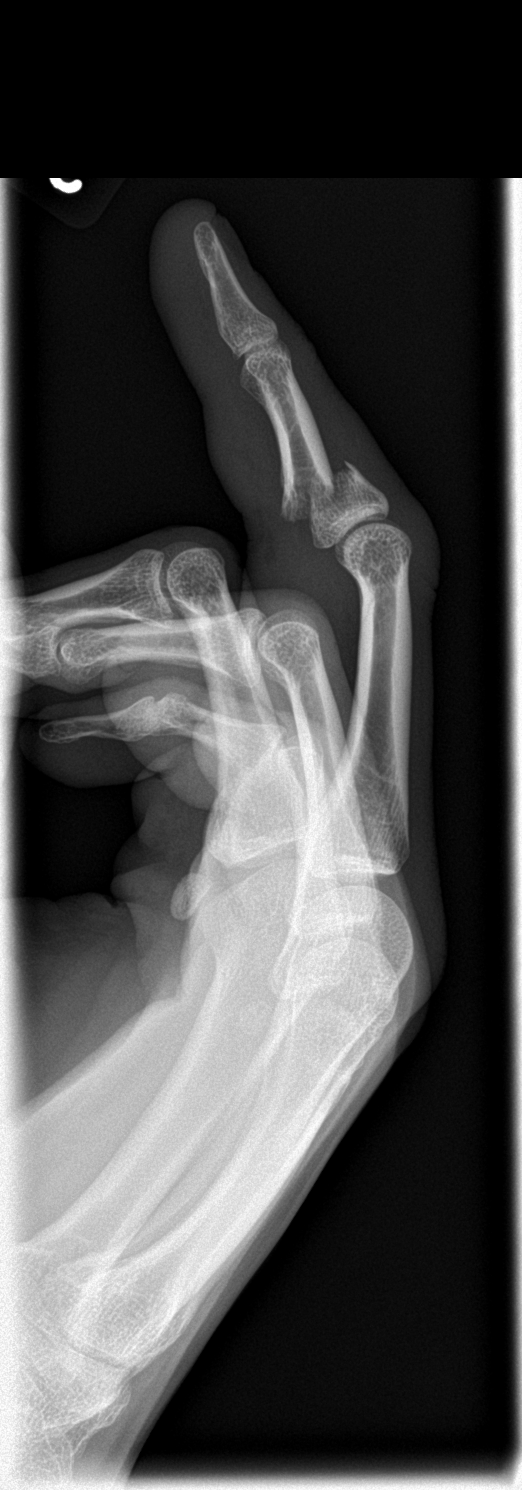

[3 of 3 positions shown; findings below may reference images not displayed]

FINDINGS: There is a volarly displaced horizontal fracture near the base of
the third middle phalanx, with approximately [DATE] shaft width volar
displacement and slight shortening. No intra-articular extension is
seen. Surrounding soft tissue swelling is noted.

Visualized joint spaces are grossly preserved.
IMPRESSION: Volarly displaced horizontal fracture near the base of the third
middle phalanx, with approximately [DATE] shaft width volar
displacement and slight shortening. No intra-articular extension
seen.

## 2018-05-07 ENCOUNTER — Ambulatory Visit
Admission: EM | Admit: 2018-05-07 | Discharge: 2018-05-07 | Disposition: A | Discharge status: Home / Self Care | Payer: Managed Care, Other (non HMO) | Attending: Family Medicine | Admitting: Family Medicine

## 2018-05-07 DIAGNOSIS — R202 Paresthesia of skin: Secondary | ICD-10-CM

## 2018-05-07 DIAGNOSIS — R200 Anesthesia of skin: Secondary | ICD-10-CM

## 2018-05-07 MED ORDER — PREDNISONE 20 MG PO TABS: 20.0000 mg | ORAL_TABLET | Freq: Every day | ORAL | 0 refills | Status: DC

## 2018-05-07 NOTE — ED Triage Notes (Signed)
Pt states he fell asleep in the recliner and now having numbness and tingling in her left leg. Did take aspirin and tylenol without relief. Having it in the bottom of his foot. Feels as if his left leg and foot is asleep.

## 2018-05-07 NOTE — ED Provider Notes (Signed)
[facility]    CSN: [id number] Arrival date & time: 05/07/18  1155     History   Chief Complaint Chief Complaint  Patient presents with  . Extremity Weakness    HPI Carlos Figueroa is a 28 y.o. male.   28 yo male with a c/o numbness/tingling since yesterday morning when waking up. States he woke up with left lower extremity numbness/tingling after sleeping in a recliner. Denies any anesthesia, fevers, chills, bowel or bladder problems.   The history is provided by the patient.  Extremity Weakness     Past Medical History:  Diagnosis Date  . History of chicken pox   . Wears glasses     There are no active problems to display for this patient.   Past Surgical History:  Procedure Laterality Date  . APPENDECTOMY     age 37  . CLOSED REDUCTION FINGER WITH PERCUTANEOUS PINNING Right 03/27/2014   Procedure: CLOSED REDUCTION PERCUTANEOUS PINNING RIGHT THIRD FINGER;  Surgeon: Cheral Almas, MD;  Location: [facility];  Service: Orthopedics;  Laterality: Right;  . WISDOM TOOTH EXTRACTION         Home Medications    Prior to Admission medications   Medication Sig Start Date End Date Taking? Authorizing Provider  predniSONE (DELTASONE) 20 MG tablet Take 1 tablet (20 mg total) by mouth daily. 05/07/18   Payton Mccallum, MD    Family History Family History  Problem Relation Age of Onset  . Alcohol abuse Father   . Colon polyps Father   . Stroke Father   . Diabetes Father   . Arthritis Maternal Grandmother   . Stroke Maternal Grandmother   . Mental illness Maternal Grandmother   . Diabetes Maternal Grandmother   . Alcohol abuse Maternal Grandfather   . Colon cancer Paternal Grandmother     Social History Social History   Tobacco Use  . Smoking status: Current Every Day Smoker    Packs/day: 1.00    Years: 10.00    Pack years: 10.00  . Smokeless tobacco: Never Used  Substance Use Topics  . Alcohol use: Yes    Comment:  occasional  . Drug use: Yes    Types: Marijuana    Comment: freq     Allergies   Meat [alpha-gal]   Review of Systems Review of Systems  Musculoskeletal: Positive for extremity weakness.     Physical Exam Triage Vital Signs ED Triage Vitals  Enc Vitals Group     BP 05/07/18 1205 113/76     Pulse Rate 05/07/18 1205 71     Resp 05/07/18 1205 18     Temp 05/07/18 1205 98.2 F (36.8 C)     Temp Source 05/07/18 1205 Oral     SpO2 05/07/18 1205 99 %     Weight 05/07/18 1207 215 lb (97.5 kg)     Height 05/07/18 1207 6\' 1"  (1.854 m)     Head Circumference --      Peak Flow --      Pain Score 05/07/18 1207 1     Pain Loc --      Pain Edu? --      Excl. in GC? --    No data found.  Updated Vital Signs BP 113/76 (BP Location: Right Arm)   Pulse 71   Temp 98.2 F (36.8 C) (Oral)   Resp 18   Ht 6\' 1"  (1.854 m)   Wt 97.5 kg   SpO2 99%  BMI 28.37 kg/m   Visual Acuity Right Eye Distance:   Left Eye Distance:   Bilateral Distance:    Right Eye Near:   Left Eye Near:    Bilateral Near:     Physical Exam Vitals signs reviewed.  Constitutional:      General: He is not in acute distress.    Appearance: He is not toxic-appearing or diaphoretic.  Neurological:     General: No focal deficit present.     Mental Status: He is alert.     Cranial Nerves: No cranial nerve deficit.     Sensory: No sensory deficit.     Motor: No weakness.     Coordination: Coordination normal.     Gait: Gait normal.     Deep Tendon Reflexes: Reflexes normal.     Comments: Lower extremities neurovascularly intact      UC Treatments / Results  Labs (all labs ordered are listed, but only abnormal results are displayed) Labs Reviewed - No data to display  EKG None  Radiology No results found.  Procedures Procedures (including critical care time)  Medications Ordered in UC Medications - No data to display  Initial Impression / Assessment and Plan / UC Course  I have  reviewed the triage vital signs and the nursing notes.  Pertinent labs & imaging results that were available during my care of the patient were reviewed by me and considered in my medical decision making (see chart for details).      Final Clinical Impressions(s) / UC Diagnoses   Final diagnoses:  Paresthesias  Lower extremity numbness    ED Prescriptions    Medication Sig Dispense Auth. Provider   predniSONE (DELTASONE) 20 MG tablet Take 1 tablet (20 mg total) by mouth daily. 7 tablet Payton Mccallum, MD     1. diagnosis reviewed with patient 2. rx as per orders above; reviewed possible side effects, interactions, risks and benefits  3. Recommend supportive treatment with rest  4. Follow-up prn if symptoms worsen or don't improve  Controlled Substance Prescriptions  Controlled Substance Registry consulted? Not Applicable   Payton Mccallum, MD 05/07/18 1400

## 2018-08-09 ENCOUNTER — Ambulatory Visit
Admission: EM | Admit: 2018-08-09 | Discharge: 2018-08-09 | Disposition: A | Discharge status: Home / Self Care | Payer: Self-pay | Attending: Family Medicine | Admitting: Family Medicine

## 2018-08-09 ENCOUNTER — Other Ambulatory Visit: Payer: Self-pay

## 2018-08-09 ENCOUNTER — Ambulatory Visit (INDEPENDENT_AMBULATORY_CARE_PROVIDER_SITE_OTHER): Payer: Self-pay

## 2018-08-09 DIAGNOSIS — S6991XA Unspecified injury of right wrist, hand and finger(s), initial encounter: Secondary | ICD-10-CM

## 2018-08-09 DIAGNOSIS — M79644 Pain in right finger(s): Secondary | ICD-10-CM

## 2018-08-09 DIAGNOSIS — W231XXA Caught, crushed, jammed, or pinched between stationary objects, initial encounter: Secondary | ICD-10-CM

## 2018-08-09 MED ORDER — MELOXICAM 15 MG PO TABS: 15.0000 mg | ORAL_TABLET | Freq: Every day | ORAL | 0 refills | Status: DC | PRN

## 2018-08-09 NOTE — ED Provider Notes (Signed)
[facility]    CSN: [id number] Arrival date & time: 08/09/18  [id number]  History   Chief Complaint Chief Complaint  Patient presents with  . Hand Injury   HPI  28 year old male presents with the above complaint.  Patient [place] and [place] wheelchair.  He states that his finger (right 5th) accidentally got caught in the chair as he threw it.  He reports pain at the PIP joint and slightly proximal.  Slightly decreased range of motion.  No medications or interventions tried.  Pain is mild in severity currently.  Exacerbated by touch/pressure.  No relieving factors.  No other complaints.  Hx reviewed as below. Past Medical History:  Diagnosis Date  . History of chicken pox   . Wears glasses    Past Surgical History:  Procedure Laterality Date  . APPENDECTOMY     age 28  . CLOSED REDUCTION FINGER WITH PERCUTANEOUS PINNING Right 03/27/2014   Procedure: CLOSED REDUCTION PERCUTANEOUS PINNING RIGHT THIRD FINGER;  Surgeon: Cheral AlmasNaiping Michael Xu, MD;  Location: [facility];  Service: Orthopedics;  Laterality: Right;  . WISDOM TOOTH EXTRACTION     Home Medications    Prior to Admission medications   Medication Sig Start Date End Date Taking? Authorizing Provider  meloxicam (MOBIC) 15 MG tablet Take 1 tablet (15 mg total) by mouth daily as needed. 08/09/18   Tommie Samsook, Stasha Naraine G, DO    Family History Family History  Problem Relation Age of Onset  . Alcohol abuse Father   . Colon polyps Father   . Stroke Father   . Diabetes Father   . Arthritis Maternal Grandmother   . Stroke Maternal Grandmother   . Mental illness Maternal Grandmother   . Diabetes Maternal Grandmother   . Alcohol abuse Maternal Grandfather   . Colon cancer Paternal Grandmother     Social History Social History   Tobacco Use  . Smoking status: Current Every Day Smoker    Packs/day: 1.00    Years: 10.00    Pack years: 10.00  . Smokeless tobacco: Never Used   Substance Use Topics  . Alcohol use: Yes    Comment: occasional  . Drug use: Yes    Types: Marijuana    Comment: freq     Allergies   Meat [alpha-gal]   Review of Systems Review of Systems  Constitutional: Negative.   Musculoskeletal:       Finger injury.   Physical Exam Triage Vital Signs ED Triage Vitals  Enc Vitals Group     BP 08/09/18 0930 113/77     Pulse Rate 08/09/18 0930 71     Resp 08/09/18 0930 18     Temp 08/09/18 0930 98 F (36.7 C)     Temp Source 08/09/18 0930 Oral     SpO2 08/09/18 0930 99 %     Weight 08/09/18 0932 220 lb (99.8 kg)     Height 08/09/18 0932 6\' 1"  (1.854 m)     Head Circumference --      Peak Flow --      Pain Score 08/09/18 0931 3     Pain Loc --      Pain Edu? --      Excl. in GC? --    No data found.  Updated Vital Signs BP 113/77 (BP Location: Right Arm)   Pulse 71   Temp 98 F (36.7 C) (Oral)   Resp 18   Ht 6\' 1"  (  1.854 m)   Wt 99.8 kg   SpO2 99%   BMI 29.03 kg/m   Visual Acuity Right Eye Distance:   Left Eye Distance:   Bilateral Distance:    Right Eye Near:   Left Eye Near:    Bilateral Near:     Physical Exam Vitals signs and nursing note reviewed.  Constitutional:      General: He is not in acute distress.    Appearance: Normal appearance.  HENT:     Head: Normocephalic and atraumatic.  Eyes:     General:        Right eye: No discharge.        Left eye: No discharge.     Conjunctiva/sclera: Conjunctivae normal.  Pulmonary:     Effort: Pulmonary effort is normal. No respiratory distress.  Musculoskeletal:     Comments: Right 5th digit -mild swelling noted at the PIP joint.  Tenderness to palpation at the PIP joint and slightly proximally.  Neurological:     Mental Status: He is alert.  Psychiatric:        Mood and Affect: Mood normal.        Behavior: Behavior normal.    UC Treatments / Results  Labs (all labs ordered are listed, but only abnormal results are displayed) Labs Reviewed - No  data to display  EKG   Radiology Dg Finger Little Right  Result Date: 08/09/2018 CLINICAL DATA:  Small finger injury yesterday while throwing a chair. EXAM: RIGHT LITTLE FINGER 2+V COMPARISON:  Right hand x-rays dated April 08, 2014. FINDINGS: There is no evidence of fracture or dislocation. There is no evidence of arthropathy or other focal bone abnormality. Soft tissues are unremarkable. IMPRESSION: Negative. Electronically Signed   By: Titus Dubin M.D.   On: 08/09/2018 10:11    Procedures Procedures (including critical care time)  Medications Ordered in UC Medications - No data to display  Initial Impression / Assessment and Plan / UC Course  I have reviewed the triage vital signs and the nursing notes.  Pertinent labs & imaging results that were available during my care of the patient were reviewed by me and considered in my medical decision making (see chart for details).    28 year old male presents with injury to his right fifth digit.  X-rays negative.  Meloxicam as needed.  Final Clinical Impressions(s) / UC Diagnoses   Final diagnoses:  Injury of finger of right hand, initial encounter     Discharge Instructions     Rest.  Ice.  Medication as needed.  Take care  Dr. Lacinda Axon    ED Prescriptions    Medication Sig Dispense Auth. Provider   meloxicam (MOBIC) 15 MG tablet Take 1 tablet (15 mg total) by mouth daily as needed. 30 tablet Coral Spikes, DO     Controlled Substance Prescriptions [place] Controlled Substance Registry consulted? Not Applicable   Coral Spikes, DO 08/09/18 1139

## 2018-08-09 NOTE — Discharge Instructions (Signed)
Rest.  Ice.  Medication as needed.  Take care  Dr.Kynnedi Zweig  

## 2018-08-09 NOTE — ED Triage Notes (Signed)
Was throwing a chair yesterday and got his right pinky caught in the chair and now it's swollen and can move it slightly. No ice or otc meds tried.

## 2018-10-12 ENCOUNTER — Ambulatory Visit
Admission: EM | Admit: 2018-10-12 | Discharge: 2018-10-12 | Disposition: A | Discharge status: Home / Self Care | Payer: Self-pay | Attending: Family Medicine | Admitting: Family Medicine

## 2018-10-12 ENCOUNTER — Other Ambulatory Visit: Payer: Self-pay

## 2018-10-12 DIAGNOSIS — W260XXA Contact with knife, initial encounter: Secondary | ICD-10-CM

## 2018-10-12 DIAGNOSIS — S61209A Unspecified open wound of unspecified finger without damage to nail, initial encounter: Secondary | ICD-10-CM

## 2018-10-12 DIAGNOSIS — S61203A Unspecified open wound of left middle finger without damage to nail, initial encounter: Secondary | ICD-10-CM

## 2018-10-12 MED ORDER — CEPHALEXIN 500 MG PO CAPS: 500.0000 mg | ORAL_CAPSULE | Freq: Three times a day (TID) | ORAL | 0 refills | Status: AC

## 2018-10-12 NOTE — ED Provider Notes (Addendum)
[facility] ____________________________________________  Time seen: Approximately 5:24 PM  I have reviewed the triage vital signs and the nursing notes.   HISTORY  Chief Complaint Laceration   HPI Carlos Figueroa is a 28 y.o. male presenting for evaluation of left middle finger laceration that occurred prior to arrival.  Patient reports he was at a job interview and working in a kitchen, using a knife to cut parsley and accidentally cut left middle distal finger.  Reports he has had continued bleeding.  States he has had difficulty removing the gauze because it dried to it.  Denies crush injury.  Reports tetanus immunization was last 2 years ago.  States pain moderate to distal finger.  Denies difficulty or loss of movement, paresthesias or other injuries.  No recent cough or sickness.  Was otherwise doing well. Applied dressing. Denies other aggravating or alleviating factors.   Past Medical History:  Diagnosis Date  . History of chicken pox   . Wears glasses     There are no active problems to display for this patient.   Past Surgical History:  Procedure Laterality Date  . APPENDECTOMY     age 22  . CLOSED REDUCTION FINGER WITH PERCUTANEOUS PINNING Right 03/27/2014   Procedure: CLOSED REDUCTION PERCUTANEOUS PINNING RIGHT THIRD FINGER;  Surgeon: Marianna Payment, MD;  Location: [facility];  Service: Orthopedics;  Laterality: Right;  . WISDOM TOOTH EXTRACTION       No current facility-administered medications for this encounter.   Current Outpatient Medications:  .  cephALEXin (KEFLEX) 500 MG capsule, Take 1 capsule (500 mg total) by mouth 3 (three) times daily for 5 days., Disp: 15 capsule, Rfl: 0  Allergies Meat [alpha-gal]  Family History  Problem Relation Age of Onset  . Alcohol abuse Father   . Colon polyps Father   . Stroke Father   . Diabetes Father   . Arthritis Maternal Grandmother   . Stroke Maternal Grandmother   .  Mental illness Maternal Grandmother   . Diabetes Maternal Grandmother   . Alcohol abuse Maternal Grandfather   . Colon cancer Paternal Grandmother     Social History Social History   Tobacco Use  . Smoking status: Current Every Day Smoker    Packs/day: 1.00    Years: 10.00    Pack years: 10.00  . Smokeless tobacco: Never Used  Substance Use Topics  . Alcohol use: Yes    Comment: occasional  . Drug use: Not Currently    Review of Systems Constitutional: No fever Cardiovascular: Denies chest pain. Respiratory: Denies shortness of breath. Musculoskeletal: Positive left finger pain. Skin: Positive laceration.   ____________________________________________   PHYSICAL EXAM:  VITAL SIGNS: ED Triage Vitals  Enc Vitals Group     BP 10/12/18 1607 116/86     Pulse Rate 10/12/18 1607 75     Resp 10/12/18 1607 18     Temp 10/12/18 1607 98.4 F (36.9 C)     Temp Source 10/12/18 1607 Oral     SpO2 10/12/18 1607 100 %     Weight 10/12/18 1607 200 lb (90.7 kg)     Height 10/12/18 1607 6\' 1"  (1.854 m)     Head Circumference --      Peak Flow --      Pain Score 10/12/18 1606 5     Pain Loc --      Pain Edu? --      Excl. in [place]? --    Vitals:  10/12/18 1607 10/12/18 1715  BP: 116/86 110/65  Pulse: 75 64  Resp: 18 18  Temp: 98.4 F (36.9 C) 97.9 F (36.6 C)  TempSrc: Oral Oral  SpO2: 100% 100%  Weight: 200 lb (90.7 kg)   Height: 6\' 1"  (1.854 m)      Constitutional: Alert and oriented.  Patient appears anxious. Eyes: Conjunctivae are normal.  ENT      Head: Normocephalic and atraumatic. Cardiovascular: Good peripheral circulation. Respiratory: Normal respiratory effort without tachypnea nor retractions.  Musculoskeletal: Steady gait.  Normal distal sensation to left hand middle finger.  Left hand middle finger with full range of motion, good distal resisted flexion and extension.  Left hand third digit distal medial aspect 1 cm superficial skin avulsion with mild  active bleeding, no foreign body, no bone or tendon visualized, tenderness present, no bony tenderness. Neurologic:  Normal speech and language. Speech is normal. No gait instability.  Skin:  Skin is warm, dry.  Except: Left third digit laceration as depicted below.    Psychiatric: Patient exhibits appropriate insight and judgment   ___________________________________________   LABS (all labs ordered are listed, but only abnormal results are displayed)  Labs Reviewed - No data to display ____________________________________________  PROCEDURES Procedures  Procedure(s) performed:  Procedure explained and verbal consent obtained. Consent: Verbal consent obtained. Written consent not obtained. Risks and benefits: risks, benefits and alternatives were discussed including infection, as well as skin staining from silver nitrate. Patient identity confirmed: verbally with patient and hospital-assigned identification number  Consent given by: patient   Laceration Repair Location: left middle finger Length: 1 cm Foreign bodies: no foreign bodies Tendon involvement: none Nerve involvement: none Preparation: Patient was prepped and draped in the usual sterile fashion. Anesthesia: none Cleaned with Betadine Irrigation solution: saline Irrigation method: jet lavage Amount of cleaning: copious No suture indicated. Single silver nitrate utilized.  Bleeding well controlled. Patient tolerate well. Wound well approximated post repair.  Antibiotic ointment and dressing applied.  Wound care instructions provided.  Observe for any signs of infection or other problems.       INITIAL IMPRESSION / ASSESSMENT AND PLAN / ED COURSE  Pertinent labs & imaging results that were available during my care of the patient were reviewed by me and considered in my medical decision making (see chart for details).  Patient appears anxious, no acute distress.  Distal fingertip avulsion, no repair  indicated.  During the removal of the adhered gauze, patient requested to take a break as he felt lightheaded.  Patient was then directed to lay back in bed which he did, and had less than 10 seconds vasovagal episode.  Patient immediately alert and oriented, cooperative and verbal afterwards.  Vitals repeated.  After episode patient drank a Coke and ate goldfish and reported feeling much better, stating he hadn't eaten yet today.  Proceeded with wound care as above.  Patient tolerated well.  Will treat empiric Keflex.  Counseled wound care and supportive care, monitoring.  Discussed follow-up and return parameters.  Discussed follow up and return parameters including no resolution or any worsening concerns. Patient verbalized understanding and agreed to plan.   ____________________________________________   FINAL CLINICAL IMPRESSION(S) / ED DIAGNOSES  Final diagnoses:  Avulsion of skin of finger, initial encounter     ED Discharge Orders         Ordered    cephALEXin (KEFLEX) 500 MG capsule  3 times daily     10/12/18 1726  Note: This dictation was prepared with Dragon dictation along with smaller phrase technology. Any transcriptional errors that result from this process are unintentional.           Renford DillsMiller, Krikor Willet, NP 10/12/18 1754

## 2018-10-12 NOTE — Discharge Instructions (Signed)
Keep clean and dry.  Monitor.  Take medication as prescribed.  Follow up with your primary care physician this week as needed. Return to Urgent care for new or worsening concerns.

## 2018-10-12 NOTE — ED Triage Notes (Signed)
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 Patient states that it was bleeding significantly when he left.

## 2022-02-10 ENCOUNTER — Ambulatory Visit (INDEPENDENT_AMBULATORY_CARE_PROVIDER_SITE_OTHER): Payer: Self-pay

## 2022-02-10 ENCOUNTER — Encounter: Payer: Self-pay | Admitting: Emergency Medicine

## 2022-02-10 ENCOUNTER — Ambulatory Visit
Admission: EM | Admit: 2022-02-10 | Discharge: 2022-02-10 | Disposition: A | Discharge status: Home / Self Care | Payer: Self-pay | Attending: Emergency Medicine | Admitting: Emergency Medicine

## 2022-02-10 DIAGNOSIS — M25522 Pain in left elbow: Secondary | ICD-10-CM

## 2022-02-10 DIAGNOSIS — W19XXXA Unspecified fall, initial encounter: Secondary | ICD-10-CM

## 2022-02-10 DIAGNOSIS — M79632 Pain in left forearm: Secondary | ICD-10-CM

## 2022-02-10 DIAGNOSIS — S46912A Strain of unspecified muscle, fascia and tendon at shoulder and upper arm level, left arm, initial encounter: Secondary | ICD-10-CM

## 2022-02-10 MED ORDER — NAPROXEN 500 MG PO TABS: 500.0000 mg | ORAL_TABLET | Freq: Two times a day (BID) | ORAL | 0 refills | Status: AC

## 2022-02-10 NOTE — Discharge Instructions (Addendum)
Your x-ray was negative for fracture, or fluid in the elbow.  I suspect that you may have subluxed the radial head with spontaneous reduction.  Please follow-up with Dr. Zigmund Daniel if not better in a week.  Wear the sling as needed for comfort.  Ice as needed.  Naprosyn combined with 1000 mg of Tylenol twice a day.  Make additional 1000 mg of Tylenol 1 more time per day.  I have placed a referral with Dr. Zigmund Daniel.  You should be seen within the next week.

## 2022-02-10 NOTE — ED Provider Notes (Signed)
HPI  SUBJECTIVE:  Carlos Figueroa is a right-handed 32 y.o. male who presents with burning, sore left elbow/forearm pain after slipping on a roof, falling on his flexed wrist with his elbow bent, then falling directly onto his elbow earlier today.  He states that he pronated his arm after falling, felt a click at the radial head.  He reports pins-and-needles like pain in his fingertips, questionable swelling at the radial head.  No bruising.  he denies any wrist, hand pain, limitation of motion.  He denies hitting his head, loss of consciousness, headache, neck pain.  He has not tried anything for this.  No alleviating factors.  Symptoms are worse with full extension, and at the extremes of supination/pronation.  He has never injured this elbow or wrist before.  PCP: None.   Past Medical History:  Diagnosis Date   History of chicken pox    Wears glasses     Past Surgical History:  Procedure Laterality Date   APPENDECTOMY     age 81   CLOSED REDUCTION FINGER WITH PERCUTANEOUS PINNING Right 03/27/2014   Procedure: CLOSED REDUCTION PERCUTANEOUS PINNING RIGHT THIRD FINGER;  Surgeon: Marianna Payment, MD;  Location: [facility];  Service: Orthopedics;  Laterality: Right;   WISDOM TOOTH EXTRACTION      Family History  Problem Relation Age of Onset   Alcohol abuse Father    Colon polyps Father    Stroke Father    Diabetes Father    Arthritis Maternal Grandmother    Stroke Maternal Grandmother    Mental illness Maternal Grandmother    Diabetes Maternal Grandmother    Alcohol abuse Maternal Grandfather    Colon cancer Paternal Grandmother     Social History   Tobacco Use   Smoking status: Every Day    Packs/day: 1.00    Years: 10.00    Total pack years: 10.00    Types: Cigarettes   Smokeless tobacco: Never  Vaping Use   Vaping Use: Former  Substance Use Topics   Alcohol use: Yes    Comment: occasional   Drug use: Not Currently    No current  facility-administered medications for this encounter.  Current Outpatient Medications:    naproxen (NAPROSYN) 500 MG tablet, Take 1 tablet (500 mg total) by mouth 2 (two) times daily., Disp: 20 tablet, Rfl: 0  Allergies  Allergen Reactions   Meat [Alpha-Gal] Itching     ROS  As noted in HPI.   Physical Exam  BP 126/88 (BP Location: Right Arm)   Pulse 87   Temp 98.7 F (37.1 C) (Oral)   Resp 16   SpO2 97%   Constitutional: Well developed, well nourished, no acute distress Eyes:  EOMI, conjunctiva normal bilaterally HENT: Normocephalic, atraumatic,mucus membranes moist Respiratory: Normal inspiratory effort Cardiovascular: Normal rate GI: nondistended skin: No rash, skin intact Musculoskeletal: Left elbow ROM  Decreased,  Unable to fully extend.  Pain with supination/pronation at the extremes.  Able to flex to 90 degrees,  mild Supracondylar region tender, Radial head very tender, Olecrenon process NT , Medial epicondyle NT , Lateral epicondyle NT, Shoulder NT, Wrist NT, Hand NT with distal NVI CR<2 secs, radial pulse intact, Sensation LT and Motor intact distally in distribution of radial, median, and ulnar nerve function.  Neurologic: Alert & oriented x 3, no focal neuro deficits Psychiatric: Speech and behavior appropriate   ED Course   Medications - No data to display  Orders Placed This Encounter  Procedures  DG Forearm Left    Standing Status:   Standing    Number of Occurrences:   1    Order Specific Question:   Reason for Exam (SYMPTOM  OR DIAGNOSIS REQUIRED)    Answer:   fall   DG Elbow Complete Left    Standing Status:   Standing    Number of Occurrences:   1    Order Specific Question:   Reason for Exam (SYMPTOM  OR DIAGNOSIS REQUIRED)    Answer:   Fall onto elbow.  Radial head and supracondylar tenderness.  Rule out fracture, dislocation, effusion   AMB referral to sports medicine    Referral Priority:   Urgent    Referral Type:   Consultation     Referral Reason:   Specialty Services Required    Referred to Provider:   Montel Culver, MD    Number of Visits Requested:   1   Apply Sling and Swathe    Standing Status:   Standing    Number of Occurrences:   1    Order Specific Question:   Laterality    Answer:   Left    No results found for this or any previous visit (from the past 24 hour(s)). DG Elbow Complete Left  Result Date: 02/10/2022 CLINICAL DATA:  Fell onto elbow, tenderness EXAM: LEFT ELBOW - COMPLETE 3+ VIEW COMPARISON:  None Available. FINDINGS: There is no evidence of fracture, dislocation, or joint effusion. There is no evidence of arthropathy or other focal bone abnormality. Soft tissues are unremarkable. IMPRESSION: Negative. Electronically Signed   By: Donavan Foil M.D.   On: 02/10/2022 20:32   DG Forearm Left  Result Date: 02/10/2022 CLINICAL DATA:  Fall, pain EXAM: LEFT FOREARM - 2 VIEW COMPARISON:  None Available. FINDINGS: There is no evidence of fracture or other focal bone lesions. Soft tissues are unremarkable. IMPRESSION: No fracture or dislocation of the left forearm. Electronically Signed   By: Delanna Ahmadi M.D.   On: 02/10/2022 20:02    ED Clinical Impression  1. Strain of left elbow, initial encounter      ED Assessment/Plan     Forearm was ordered prior to my evaluation.  His films are normal normal, however, patient reports supracondylar and radial head tenderness.  Will get dedicated films of the elbow  Reviewed imaging independently.  Normal forearm, elbow.  See radiology report for full details.  X-rays negative for fracture.  Home with Tylenol/ibuprofen, placing in a sling here for comfort as patient is holding his elbow flexed against his body.  Placing referral to Dr. Zigmund Daniel, sports medicine for follow-up.  Discussed  imaging, MDM, treatment plan, and plan for follow-up with patient. patient agrees with plan.   Meds ordered this encounter  Medications   naproxen (NAPROSYN) 500 MG  tablet    Sig: Take 1 tablet (500 mg total) by mouth 2 (two) times daily.    Dispense:  20 tablet    Refill:  0      *This clinic note was created using [employer]. Therefore, there may be occasional mistakes despite careful proofreading.  ?    Melynda Ripple, MD 02/11/22 1745

## 2022-02-10 NOTE — ED Triage Notes (Signed)
Pt fell while working on a roof today. He did not fall off the roof. He landed on his left arm.
